# Patient Record
Sex: Male | Born: 1990 | Race: White | Hispanic: No | Marital: Married | Smoking: Never smoker
Health system: Southern US, Community
[De-identification: ages and names within clinical notes are randomized; demographics above are authoritative.]

---

## 2015-10-14 ENCOUNTER — Other Ambulatory Visit (HOSPITAL_COMMUNITY): Payer: Self-pay | Admitting: Chiropractic Medicine

## 2015-10-14 ENCOUNTER — Ambulatory Visit (HOSPITAL_COMMUNITY)
Admission: RE | Admit: 2015-10-14 | Discharge: 2015-10-14 | Disposition: A | Discharge status: Home / Self Care | Payer: 59 | Source: Ambulatory Visit | Attending: Chiropractic Medicine | Admitting: Chiropractic Medicine

## 2015-10-14 ENCOUNTER — Emergency Department (HOSPITAL_COMMUNITY)
Admission: EM | Admit: 2015-10-14 | Discharge: 2015-10-14 | Disposition: A | Discharge status: Home / Self Care | Payer: 59 | Attending: Emergency Medicine | Admitting: Emergency Medicine

## 2015-10-14 ENCOUNTER — Encounter (HOSPITAL_COMMUNITY): Payer: Self-pay | Admitting: Emergency Medicine

## 2015-10-14 DIAGNOSIS — M545 Low back pain, unspecified: Secondary | ICD-10-CM

## 2015-10-14 DIAGNOSIS — M62838 Other muscle spasm: Secondary | ICD-10-CM | POA: Insufficient documentation

## 2015-10-14 DIAGNOSIS — M5136 Other intervertebral disc degeneration, lumbar region: Secondary | ICD-10-CM

## 2015-10-14 DIAGNOSIS — R52 Pain, unspecified: Secondary | ICD-10-CM

## 2015-10-14 MED ORDER — IBUPROFEN 800 MG PO TABS: 800.0000 mg | ORAL_TABLET | Freq: Three times a day (TID) | ORAL | 0 refills | Status: DC

## 2015-10-14 MED ORDER — CYCLOBENZAPRINE HCL 10 MG PO TABS: 10.0000 mg | ORAL_TABLET | Freq: Two times a day (BID) | ORAL | 0 refills | Status: DC | PRN

## 2015-10-14 MED ORDER — IBUPROFEN 800 MG PO TABS
800.0000 mg | ORAL_TABLET | Freq: Once | ORAL | Status: AC
  Administered 2015-10-14: 800 mg via ORAL
  Filled 2015-10-14 (×2): qty 1

## 2015-10-14 NOTE — ED Triage Notes (Signed)
Patient having back spasms in lower back. Patient was seen at PCP today and [facility]. Patient was walking on side walk back towards car and back spasm caught causing patient to go to ground. patient denies any pain from fall or LOC.

## 2015-10-14 NOTE — ED Provider Notes (Signed)
[facility]-EMERGENCY DEPT Provider Note   CSN: [id number] Arrival date & time: 10/14/15  1847  By signing my name below, I, Emmanuella Mensah, attest that this documentation has been prepared under the direction and in the presence of Cheri Fowler, PA-C. Electronically Signed: Angelene Giovanni, ED Scribe. 10/14/15. 7:20 PM.   History   Chief Complaint Chief Complaint  Patient presents with  . Back Pain   HPI Comments:  Christian Trevino is a 25 y.o. male who presents to the Emergency Department complaining of gradually worsening moderate lower back pain he describes as cramping and spasm onset this morning. He reports associated intermittent episodes of "back spasms" where he notes sudden onset of sharp pain down his spine, causing his knees to buckle and fall. He states that his symptoms are worse while ambulating. He explains that he was seen by a Chiropractor today where he had treatment and was advised to come to [facility] for outpatient x-ray. He adds that when he was leaving radiology, he fell in the parking lot due to his back spasms. He denies any LOC or head injuries during the fall. Denies any other injuries.  No alleviating factors noted. He states that he tried 2 Aleve at 8 am this morning. He denies any IV drug use. Pt has NKDA. He denies any fever, chills, abdominal pain, numbness/tingling in BLE, weakness, bowel/bladder incontinence, urinary symptoms, or any other symptoms. No gait abnormalities.   The history is provided by the patient. No language interpreter was used.    History reviewed. No pertinent past medical history.  There are no active problems to display for this patient.   History reviewed. No pertinent surgical history.     Home Medications    Prior to Admission medications   Medication Sig Start Date End Date Taking? Authorizing Provider  cyclobenzaprine (FLEXERIL) 10 MG tablet Take 1 tablet (10 mg total) by mouth 2 (two) times daily as needed for muscle spasms. 10/14/15    Cheri Fowler, PA-C  ibuprofen (ADVIL,MOTRIN) 800 MG tablet Take 1 tablet (800 mg total) by mouth 3 (three) times daily. 10/14/15   Cheri Fowler, PA-C    Family History No family history on file.  Social History Social History  Substance Use Topics  . Smoking status: Never Smoker  . Smokeless tobacco: Never Used  . Alcohol use No     Allergies   Review of patient's allergies indicates no known allergies.   Review of Systems Review of Systems  Constitutional: Negative for chills and fever.  Gastrointestinal: Negative for abdominal pain.  Musculoskeletal: Positive for back pain.  Neurological: Negative for weakness and numbness.  All other systems reviewed and are negative.    Physical Exam Updated Vital Signs BP 128/76 (BP Location: Right Arm)   Pulse 80   Temp 98.6 F (37 C) (Oral)   Resp 17   Ht 5\' 11"  (1.803 m)   Wt 88.5 kg   SpO2 95%   BMI 27.20 kg/m   Physical Exam  Constitutional: He is oriented to person, place, and time. He appears well-developed and well-nourished.  HENT:  Head: Normocephalic and atraumatic.  Right Ear: External ear normal.  Left Ear: External ear normal.  Eyes: Conjunctivae are normal. No scleral icterus.  Neck: No tracheal deviation present.  Cardiovascular: Normal rate, regular rhythm, normal heart sounds and intact distal pulses.   Pulses:      Dorsalis pedis pulses are 2+ on the right side, and 2+ on the left side.  Pulmonary/Chest: Effort normal  and breath sounds normal. No respiratory distress.  Abdominal: Soft. Bowel sounds are normal. He exhibits no distension. There is no tenderness.  Musculoskeletal: Normal range of motion. He exhibits tenderness.       Lumbar back: He exhibits spasm. He exhibits no tenderness and no bony tenderness.  No spinous process tenderness.  No step offs. No crepitus.  Neurological: He is alert and oriented to person, place, and time.  No saddle anesthesia. Strength and sensation intact bilaterally  throughout lower extremities.  Normal gait.   Skin: Skin is warm and dry.  Psychiatric: He has a normal mood and affect. His behavior is normal.     ED Treatments / Results  DIAGNOSTIC STUDIES: Oxygen Saturation is 95% on RA, adequate by my interpretation.    COORDINATION OF CARE: 7:17 PM- Pt advised of plan for treatment and pt agrees. Pt will receive ibuprofen and Flexeril.    Labs (all labs ordered are listed, but only abnormal results are displayed) Labs Reviewed - No data to display  EKG  EKG Interpretation None       Radiology No results found.  Procedures Procedures (including critical care time)  Medications Ordered in ED Medications  ibuprofen (ADVIL,MOTRIN) tablet 800 mg (800 mg Oral Given 10/14/15 1942)     Initial Impression / Assessment and Plan / ED Course  Cheri FowlerKayla Taiga Lupinacci, PA-C has reviewed the triage vital signs and the nursing notes.  Pertinent labs & imaging results that were available during my care of the patient were reviewed by me and considered in my medical decision making (see chart for details).  Clinical Course   Patient presents with low back pain.  VSS.  No injury/trauma.  No red flags.  No neurological deficits.  Distal pulses intact.  Suspect low back strain or other mechanical cause.  Doubt cauda equina.  Doubt infectious process.  Doubt AAA.  Discussed return precautions to the ED.  Follow up with PCP.   Final Clinical Impressions(s) / ED Diagnoses   Final diagnoses:  Muscle spasm  Acute low back pain without sciatica, unspecified back pain laterality    New Prescriptions New Prescriptions   CYCLOBENZAPRINE (FLEXERIL) 10 MG TABLET    Take 1 tablet (10 mg total) by mouth 2 (two) times daily as needed for muscle spasms.   IBUPROFEN (ADVIL,MOTRIN) 800 MG TABLET    Take 1 tablet (800 mg total) by mouth 3 (three) times daily.   I personally performed the services described in this documentation, which was scribed in my presence. The  recorded information has been reviewed and is accurate.    Cheri FowlerKayla Bricelyn Freestone, PA-C 10/14/15 1950    Bethann BerkshireJoseph Zammit, MD 10/14/15 2223

## 2018-02-24 ENCOUNTER — Other Ambulatory Visit: Payer: Self-pay

## 2018-02-24 ENCOUNTER — Emergency Department (HOSPITAL_BASED_OUTPATIENT_CLINIC_OR_DEPARTMENT_OTHER): Payer: 59

## 2018-02-24 ENCOUNTER — Emergency Department (HOSPITAL_BASED_OUTPATIENT_CLINIC_OR_DEPARTMENT_OTHER)
Admission: EM | Admit: 2018-02-24 | Discharge: 2018-02-24 | Disposition: A | Discharge status: Home / Self Care | Payer: 59 | Attending: Emergency Medicine | Admitting: Emergency Medicine

## 2018-02-24 ENCOUNTER — Encounter (HOSPITAL_BASED_OUTPATIENT_CLINIC_OR_DEPARTMENT_OTHER): Payer: Self-pay | Admitting: *Deleted

## 2018-02-24 DIAGNOSIS — Y998 Other external cause status: Secondary | ICD-10-CM | POA: Diagnosis not present

## 2018-02-24 DIAGNOSIS — Y9389 Activity, other specified: Secondary | ICD-10-CM | POA: Diagnosis not present

## 2018-02-24 DIAGNOSIS — R52 Pain, unspecified: Secondary | ICD-10-CM | POA: Diagnosis not present

## 2018-02-24 DIAGNOSIS — S060X0A Concussion without loss of consciousness, initial encounter: Secondary | ICD-10-CM | POA: Diagnosis not present

## 2018-02-24 DIAGNOSIS — S0990XA Unspecified injury of head, initial encounter: Secondary | ICD-10-CM | POA: Diagnosis present

## 2018-02-24 DIAGNOSIS — W19XXXA Unspecified fall, initial encounter: Secondary | ICD-10-CM

## 2018-02-24 DIAGNOSIS — W132XXA Fall from, out of or through roof, initial encounter: Secondary | ICD-10-CM | POA: Insufficient documentation

## 2018-02-24 DIAGNOSIS — Y9289 Other specified places as the place of occurrence of the external cause: Secondary | ICD-10-CM | POA: Insufficient documentation

## 2018-02-24 LAB — CBC
HCT: 48.7 % (ref 39.0–52.0)
Hemoglobin: 16.1 g/dL (ref 13.0–17.0)
MCH: 30.3 pg (ref 26.0–34.0)
MCHC: 33.1 g/dL (ref 30.0–36.0)
MCV: 91.5 fL (ref 80.0–100.0)
Platelets: 178 10*3/uL (ref 150–400)
RBC: 5.32 MIL/uL (ref 4.22–5.81)
RDW: 12.8 % (ref 11.5–15.5)
WBC: 5.8 10*3/uL (ref 4.0–10.5)
nRBC: 0 % (ref 0.0–0.2)

## 2018-02-24 LAB — LACTIC ACID, PLASMA: Lactic Acid, Venous: 1.2 mmol/L (ref 0.5–1.9)

## 2018-02-24 LAB — COMPREHENSIVE METABOLIC PANEL
ALT: 22 U/L (ref 0–44)
AST: 23 U/L (ref 15–41)
Albumin: 4.3 g/dL (ref 3.5–5.0)
Alkaline Phosphatase: 71 U/L (ref 38–126)
Anion gap: 7 (ref 5–15)
BUN: 15 mg/dL (ref 6–20)
CO2: 24 mmol/L (ref 22–32)
Calcium: 8.8 mg/dL — ABNORMAL LOW (ref 8.9–10.3)
Chloride: 108 mmol/L (ref 98–111)
Creatinine, Ser: 1.24 mg/dL (ref 0.61–1.24)
GFR calc Af Amer: >60 mL/min (ref >60)
GFR calc non Af Amer: >60 mL/min (ref >60)
Glucose, Bld: 106 mg/dL — ABNORMAL HIGH (ref 70–99)
Potassium: 3.5 mmol/L (ref 3.5–5.1)
Sodium: 139 mmol/L (ref 135–145)
Total Bilirubin: 0.8 mg/dL (ref 0.3–1.2)
Total Protein: 7.2 g/dL (ref 6.5–8.1)

## 2018-02-24 MED ORDER — IOPAMIDOL (ISOVUE-300) INJECTION 61%
100.0000 mL | Freq: Once | INTRAVENOUS | Status: AC | PRN
  Administered 2018-02-24: 100 mL via INTRAVENOUS

## 2018-02-24 MED ORDER — ACETAMINOPHEN 325 MG PO TABS: 650.0000 mg | ORAL_TABLET | Freq: Once | ORAL | Status: DC

## 2018-02-24 MED ORDER — ONDANSETRON HCL 4 MG PO TABS: 4.0000 mg | ORAL_TABLET | Freq: Three times a day (TID) | ORAL | 0 refills | Status: DC | PRN

## 2018-02-24 MED ORDER — ONDANSETRON 4 MG PO TBDP: 4.0000 mg | ORAL_TABLET | Freq: Once | ORAL | Status: DC

## 2018-02-24 NOTE — ED Notes (Signed)
Pt taken to radiology

## 2018-02-24 NOTE — ED Triage Notes (Addendum)
Fall. He may have fallen off a roof. Confused. Ambulatory. No scalp hematomas noted. Pt denies neck pain. He does have some wrist pain and bruising to his right lower leg. His wife states he fell off a single story roof landing onto a wooden deck.

## 2018-02-24 NOTE — ED Provider Notes (Signed)
[facility] EMERGENCY DEPARTMENT Provider Note   CSN: [id number] Arrival date & time: 02/24/18  1849    History   Chief Complaint Chief Complaint  Patient presents with  . Fall    HPI Christian Trevino is a 28 y.o. male.     The history is provided by the patient.  Fall  This is a new problem. The current episode started 1 to 2 hours ago. The problem occurs constantly. The problem has not changed since onset.Associated symptoms include headaches. Pertinent negatives include no chest pain, no abdominal pain and no shortness of breath. Associated symptoms comments: Patient fell off roof prior to arrival trying to fix cable antenna. Ambulatory since but confused and family concerned for concussion/head injury. Has no complaints but continues to ask the same questions. Hx of concussion possibly. . Nothing aggravates the symptoms. Nothing relieves the symptoms. He has tried nothing for the symptoms.    History reviewed. No pertinent past medical history.  There are no active problems to display for this patient.   History reviewed. No pertinent surgical history.      Home Medications    Prior to Admission medications   Medication Sig Start Date End Date Taking? Authorizing Provider  cyclobenzaprine (FLEXERIL) 10 MG tablet Take 1 tablet (10 mg total) by mouth 2 (two) times daily as needed for muscle spasms. 10/14/15   Cheri Fowler, PA-C  ibuprofen (ADVIL,MOTRIN) 800 MG tablet Take 1 tablet (800 mg total) by mouth 3 (three) times daily. 10/14/15   Cheri Fowler, PA-C    Family History No family history on file.  Social History Social History   Tobacco Use  . Smoking status: Never Smoker  . Smokeless tobacco: Never Used  Substance Use Topics  . Alcohol use: No  . Drug use: Not on file     Allergies   Patient has no known allergies.   Review of Systems Review of Systems  Constitutional: Negative for chills and fever.  HENT: Negative for ear pain and sore throat.    Eyes: Negative for pain and visual disturbance.  Respiratory: Negative for cough and shortness of breath.   Cardiovascular: Negative for chest pain and palpitations.  Gastrointestinal: Negative for abdominal pain and vomiting.  Genitourinary: Negative for dysuria and hematuria.  Musculoskeletal: Positive for arthralgias (pain to right wrist and right tibia). Negative for back pain.  Skin: Negative for color change and rash.  Neurological: Positive for headaches. Negative for dizziness, tremors, seizures, syncope, facial asymmetry, speech difficulty, weakness and numbness.       Repetitive questioning  All other systems reviewed and are negative.    Physical Exam Updated Vital Signs BP 128/86   Pulse 82   Temp 98.4 F (36.9 C) (Oral)   Resp 16   Ht 6' (1.829 m)   Wt 90.7 kg   SpO2 99%   BMI 27.12 kg/m   Physical Exam Vitals signs and nursing note reviewed.  Constitutional:      Appearance: He is well-developed.  HENT:     Head: Normocephalic and atraumatic.     Right Ear: Tympanic membrane normal.     Left Ear: Tympanic membrane normal.     Nose: Nose normal.     Mouth/Throat:     Mouth: Mucous membranes are moist.  Eyes:     Extraocular Movements: Extraocular movements intact.     Conjunctiva/sclera: Conjunctivae normal.     Pupils: Pupils are equal, round, and reactive to light.  Neck:  Musculoskeletal: Normal range of motion and neck supple. No neck rigidity or muscular tenderness.  Cardiovascular:     Rate and Rhythm: Normal rate and regular rhythm.     Pulses: Normal pulses.     Heart sounds: Normal heart sounds. No murmur.  Pulmonary:     Effort: Pulmonary effort is normal. No respiratory distress.     Breath sounds: Normal breath sounds.  Abdominal:     General: There is no distension.     Palpations: Abdomen is soft.     Tenderness: There is no abdominal tenderness. There is guarding.  Musculoskeletal: Normal range of motion.        General:  Tenderness (right tibia, right wrist) present. No deformity.     Right lower leg: No edema.     Left lower leg: No edema.     Comments: No midline spinal tenderness  Skin:    General: Skin is warm and dry.     Capillary Refill: Capillary refill takes less than 2 seconds.     Findings: Bruising present. No rash.     Comments: Abrasion to right tibia  Neurological:     General: No focal deficit present.     Mental Status: He is alert.     Cranial Nerves: No cranial nerve deficit.     Sensory: No sensory deficit.     Motor: No weakness.     Coordination: Coordination normal.     Gait: Gait normal.     Comments: Patient with repetitive questioning but is able to follow commands, moves all extremities, appears neurologically intact with 5+ out of 5 strength throughout, normal sensation, no drift, normal finger-to-nose finger  Psychiatric:        Mood and Affect: Mood normal.      ED Treatments / Results  Labs (all labs ordered are listed, but only abnormal results are displayed) Labs Reviewed  COMPREHENSIVE METABOLIC PANEL - Abnormal; Notable for the following components:      Result Value   Glucose, Bld 106 (*)    Calcium 8.8 (*)    All other components within normal limits  CBC  LACTIC ACID, PLASMA  URINALYSIS, ROUTINE W REFLEX MICROSCOPIC    EKG None  Radiology Dg Wrist Complete Right  Result Date: 02/24/2018 CLINICAL DATA:  Fall.  Right wrist pain.  Initial encounter. EXAM: RIGHT WRIST - COMPLETE 3+ VIEW COMPARISON:  None. FINDINGS: There is no evidence of fracture or dislocation. There is no evidence of arthropathy or other focal bone abnormality. Soft tissues are unremarkable. IMPRESSION: Negative. Electronically Signed   By: Sebastian Ache M.D.   On: 02/24/2018 20:28   Dg Tibia/fibula Right  Result Date: 02/24/2018 CLINICAL DATA:  Fall.  Lower leg pain.  Initial encounter. EXAM: RIGHT TIBIA AND FIBULA - 2 VIEW COMPARISON:  None. FINDINGS: There is no evidence of  fracture or other focal bone lesions. Soft tissues are unremarkable. IMPRESSION: Negative. Electronically Signed   By: Sebastian Ache M.D.   On: 02/24/2018 20:29   Ct Head Wo Contrast  Result Date: 02/24/2018 CLINICAL DATA:  28 year old male status post fall from roof. Right side headache and pain. EXAM: CT HEAD WITHOUT CONTRAST CT CERVICAL SPINE WITHOUT CONTRAST TECHNIQUE: Multidetector CT imaging of the head and cervical spine was performed following the standard protocol without intravenous contrast. Multiplanar CT image reconstructions of the cervical spine were also generated. COMPARISON:  None. FINDINGS: CT HEAD FINDINGS Brain: No midline shift, ventriculomegaly, mass effect, evidence of mass lesion, intracranial  hemorrhage or evidence of cortically based acute infarction. Gray-white matter differentiation is within normal limits throughout the brain. Vascular: No suspicious intracranial vascular hyperdensity. Skull: Intact. Sinuses/Orbits: Visualized paranasal sinuses and mastoids are well pneumatized. Other: Negative orbits. No scalp hematoma or soft tissue gas. Small benign appearing probable sebaceous cyst at the left vertex. CT CERVICAL SPINE FINDINGS Alignment: Partial straightening of cervical lordosis. Cervicothoracic junction alignment is within normal limits. Bilateral posterior element alignment is within normal limits. Skull base and vertebrae: Visualized skull base is intact. No atlanto-occipital dissociation. No acute osseous abnormality identified. Soft tissues and spinal canal: No prevertebral fluid or swelling. No visible canal hematoma. Negative noncontrast neck soft tissues. Disc levels:  No significant degenerative changes. Upper chest: Chest CT reported separately today. 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 IMPRESSION: 1. No acute traumatic injury identified in the head or cervical spine. 2. Negative noncontrast CT appearance of the brain. Electronically  Signed   By: Odessa Fleming M.D.   On: 02/24/2018 20:12   Ct Chest W Contrast  Result Date: 02/24/2018 CLINICAL DATA:  28 year old male status post fall from roof. Right side headache and pain. EXAM: CT CHEST, ABDOMEN, AND PELVIS WITH CONTRAST TECHNIQUE: Multidetector CT imaging of the chest, abdomen and pelvis was performed following the standard protocol during bolus administration of intravenous contrast. CONTRAST:  ISOVUE-300 IOPAMIDOL (ISOVUE-300) INJECTION 61% COMPARISON:  Cervical spine CT today reported separately. FINDINGS: CT CHEST FINDINGS Cardiovascular: Intact aorta. No cardiomegaly or pericardial effusion. Negative thoracic inlet. Mediastinum/Nodes: Small volume residual thymus suspected in the anterior superior mediastinum (normal variant). No convincing mediastinal hematoma. No lymphadenopathy. Lungs/Pleura: Major airways are patent. Minor dependent atelectasis in the right lung. No pneumothorax, pleural effusion, or pulmonary contusion. Musculoskeletal: No rib fracture identified. Subtle concavity of the T4 superior endplate (series 5, image 96) with no fracture lucency. Ununited T1 spinous process ossification center. Other thoracic vertebrae appear intact. Visible shoulder osseous structures appear intact. CT ABDOMEN PELVIS FINDINGS Hepatobiliary: Negative liver and gallbladder. Pancreas: Negative. Spleen: Negative. Adrenals/Urinary Tract: Normal adrenal glands. Punctate left renal lower pole nephrolithiasis (series 4, image 74). Punctate right renal upper pole nephrolithiasis suspected on image 88. Normal bilateral renal enhancement and contrast excretion. Normal proximal ureters. Unremarkable urinary bladder. Several pelvic phleboliths on the left. Stomach/Bowel: Mild motion artifact in the upper abdomen. Negative large bowel. Normal appendix (series 2, image 96). Negative terminal ileum. No dilated small bowel. Negative stomach and duodenum. No free air, free fluid. Vascular/Lymphatic:  Abdominal aorta and other major arterial structures appear patent and normal. Portal venous system is patent. Reproductive: Negative. Other: No pelvic free fluid. Musculoskeletal: Lumbar levels, sacrum and SI joints appear intact. No pelvis or proximal femur fracture. Benign bone island suspected in the left sacral ala. No superficial soft tissue injury identified. IMPRESSION: 1. No acute traumatic injury identified in the chest, abdomen, or pelvis. 2. Thoracic and Lumbar spine reported separately. 3. Punctate bilateral nephrolithiasis. Electronically Signed   By: Odessa Fleming M.D.   On: 02/24/2018 20:23   Ct Cervical Spine Wo Contrast  Result Date: 02/24/2018 CLINICAL DATA:  29 year old male status post fall from roof. Right side headache and pain. EXAM: CT HEAD WITHOUT CONTRAST CT CERVICAL SPINE WITHOUT CONTRAST TECHNIQUE: Multidetector CT imaging of the head and cervical spine was performed following the standard protocol without intravenous contrast. Multiplanar CT image reconstructions of the cervical spine were also generated. COMPARISON:  None. FINDINGS: CT HEAD FINDINGS Brain: No midline shift, ventriculomegaly, mass  effect, evidence of mass lesion, intracranial hemorrhage or evidence of cortically based acute infarction. Gray-white matter differentiation is within normal limits throughout the brain. Vascular: No suspicious intracranial vascular hyperdensity. Skull: Intact. Sinuses/Orbits: Visualized paranasal sinuses and mastoids are well pneumatized. Other: Negative orbits. No scalp hematoma or soft tissue gas. Small benign appearing probable sebaceous cyst at the left vertex. CT CERVICAL SPINE FINDINGS Alignment: Partial straightening of cervical lordosis. Cervicothoracic junction alignment is within normal limits. Bilateral posterior element alignment is within normal limits. Skull base and vertebrae: Visualized skull base is intact. No atlanto-occipital dissociation. No acute osseous abnormality  identified. Soft tissues and spinal canal: No prevertebral fluid or swelling. No visible canal hematoma. Negative noncontrast neck soft tissues. Disc levels:  No significant degenerative changes. Upper chest: Chest CT reported separately today. 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 IMPRESSION: 1. No acute traumatic injury identified in the head or cervical spine. 2. Negative noncontrast CT appearance of the brain. Electronically Signed   By: Odessa FlemingH  Hall M.D.   On: 02/24/2018 20:12   Ct Abdomen Pelvis W Contrast  Result Date: 02/24/2018 CLINICAL DATA:  28 year old male status post fall from roof. Right side headache and pain. EXAM: CT CHEST, ABDOMEN, AND PELVIS WITH CONTRAST TECHNIQUE: Multidetector CT imaging of the chest, abdomen and pelvis was performed following the standard protocol during bolus administration of intravenous contrast. CONTRAST:  100mL ISOVUE-300 IOPAMIDOL (ISOVUE-300) INJECTION 61% COMPARISON:  Cervical spine CT today reported separately. FINDINGS: CT CHEST FINDINGS Cardiovascular: Intact aorta. No cardiomegaly or pericardial effusion. Negative thoracic inlet. Mediastinum/Nodes: Small volume residual thymus suspected in the anterior superior mediastinum (normal variant). No convincing mediastinal hematoma. No lymphadenopathy. Lungs/Pleura: Major airways are patent. Minor dependent atelectasis in the right lung. No pneumothorax, pleural effusion, or pulmonary contusion. Musculoskeletal: No rib fracture identified. Subtle concavity of the T4 superior endplate (series 5, image 96) with no fracture lucency. Ununited T1 spinous process ossification center. Other thoracic vertebrae appear intact. Visible shoulder osseous structures appear intact. CT ABDOMEN PELVIS FINDINGS Hepatobiliary: Negative liver and gallbladder. Pancreas: Negative. Spleen: Negative. Adrenals/Urinary Tract: Normal adrenal glands. Punctate left renal lower pole nephrolithiasis (series 4, image 74).  Punctate right renal upper pole nephrolithiasis suspected on image 88. Normal bilateral renal enhancement and contrast excretion. Normal proximal ureters. Unremarkable urinary bladder. Several pelvic phleboliths on the left. Stomach/Bowel: Mild motion artifact in the upper abdomen. Negative large bowel. Normal appendix (series 2, image 96). Negative terminal ileum. No dilated small bowel. Negative stomach and duodenum. No free air, free fluid. Vascular/Lymphatic: Abdominal aorta and other major arterial structures appear patent and normal. Portal venous system is patent. Reproductive: Negative. Other: No pelvic free fluid. Musculoskeletal: Lumbar levels, sacrum and SI joints appear intact. No pelvis or proximal femur fracture. Benign bone island suspected in the left sacral ala. No superficial soft tissue injury identified. IMPRESSION: 1. No acute traumatic injury identified in the chest, abdomen, or pelvis. 2. Thoracic and Lumbar spine reported separately. 3. Punctate bilateral nephrolithiasis. Electronically Signed   By: Odessa FlemingH  Hall M.D.   On: 02/24/2018 20:23   Ct T-spine No Charge  Result Date: 02/24/2018 CLINICAL DATA:  28 year old male status post fall from roof. Right side headache and pain. EXAM: CT THORACIC AND LUMBAR SPINE WITH CONTRAST TECHNIQUE: Technique: Multiplanar CT images of the thoracic and lumbar spine were reconstructed from contemporary CT of the chest common abdomen and Pelvis. CONTRAST:  None additional. COMPARISON:  CT cervical spine, chest, Abdomen, and Pelvis today are reported  separately. FINDINGS: THORACIC SPINE: Segmentation: Normal. Alignment: Preserved thoracic kyphosis. Vertebrae: Bone mineralization is within normal limits. Subtle concavity of the T4 superior endplate (series 10, image 23), but no associated fracture lucency, and no paravertebral soft tissue stranding or edema is evident. The other thoracic vertebrae appear intact. There is an unfused ossification center of the T1  spinous process (normal variant). Paraspinal and other soft tissues: Negative visualized posterior paraspinal soft tissues. Chest and abdominal viscera reported separately today. Disc levels: Evidence of minimal disc and endplate degeneration at T8-T9. Minimal posterior element hypertrophy at T10-T11. Otherwise negative. No CT evidence of thoracic spinal stenosis. LUMBAR SPINE: Segmentation: Normal. Alignment: Mild retrolisthesis of L5 on S1. Otherwise normal lumbar lordosis. Vertebrae: Intact lumbar vertebrae. Intact visible sacrum and SI joints. Benign left S1 sacral bone island suspected. Paraspinal and other soft tissues: Negative visualized posterior paraspinal soft tissues. Abdominal and pelvic viscera reported separately. Disc levels: Lumbar disc bulging at L4-L5 and L5-S1 with mild endplate spurring. Mild facet hypertrophy at the latter. Capacious spinal canal, no lumbar spinal stenosis. IMPRESSION: 1. Subtle concavity of the T4 superior endplate, but no visible fracture. This is probably a normal variant but if the patient has persistent upper thoracic spine then consider a mild traumatic compression fracture (non-contrast Thoracic Spine MRI would be confirmatory). 2. Otherwise no acute osseous abnormality in the thoracic and lumbar spine. 3. Mild lower lumbar disc and endplate degeneration. No thoracic or lumbar spinal stenosis. 4.  CT Chest, Abdomen, and Pelvis today are reported separately. Electronically Signed   By: Odessa Fleming M.D.   On: 02/24/2018 20:46   Ct L-spine No Charge  Result Date: 02/24/2018 CLINICAL DATA:  28 year old male status post fall from roof. Right side headache and pain. EXAM: CT THORACIC AND LUMBAR SPINE WITH CONTRAST TECHNIQUE: Technique: Multiplanar CT images of the thoracic and lumbar spine were reconstructed from contemporary CT of the chest common abdomen and Pelvis. CONTRAST:  None additional. COMPARISON:  CT cervical spine, chest, Abdomen, and Pelvis today are reported  separately. FINDINGS: THORACIC SPINE: Segmentation: Normal. Alignment: Preserved thoracic kyphosis. Vertebrae: Bone mineralization is within normal limits. Subtle concavity of the T4 superior endplate (series 10, image 23), but no associated fracture lucency, and no paravertebral soft tissue stranding or edema is evident. The other thoracic vertebrae appear intact. There is an unfused ossification center of the T1 spinous process (normal variant). Paraspinal and other soft tissues: Negative visualized posterior paraspinal soft tissues. Chest and abdominal viscera reported separately today. Disc levels: Evidence of minimal disc and endplate degeneration at T8-T9. Minimal posterior element hypertrophy at T10-T11. Otherwise negative. No CT evidence of thoracic spinal stenosis. LUMBAR SPINE: Segmentation: Normal. Alignment: Mild retrolisthesis of L5 on S1. Otherwise normal lumbar lordosis. Vertebrae: Intact lumbar vertebrae. Intact visible sacrum and SI joints. Benign left S1 sacral bone island suspected. Paraspinal and other soft tissues: Negative visualized posterior paraspinal soft tissues. Abdominal and pelvic viscera reported separately. Disc levels: Lumbar disc bulging at L4-L5 and L5-S1 with mild endplate spurring. Mild facet hypertrophy at the latter. Capacious spinal canal, no lumbar spinal stenosis. IMPRESSION: 1. Subtle concavity of the T4 superior endplate, but no visible fracture. This is probably a normal variant but if the patient has persistent upper thoracic spine then consider a mild traumatic compression fracture (non-contrast Thoracic Spine MRI would be confirmatory). 2. Otherwise no acute osseous abnormality in the thoracic and lumbar spine. 3. Mild lower lumbar disc and endplate degeneration. No thoracic or lumbar spinal stenosis. 4.  CT  Chest, Abdomen, and Pelvis today are reported separately. Electronically Signed   By: Odessa Fleming M.D.   On: 02/24/2018 20:46    Procedures Procedures (including  critical care time)  Medications Ordered in ED Medications  acetaminophen (TYLENOL) tablet 650 mg (has no administration in time range)  ondansetron (ZOFRAN-ODT) disintegrating tablet 4 mg (has no administration in time range)  iopamidol (ISOVUE-300) 61 % injection 100 mL (100 mLs Intravenous Contrast Given 02/24/18 1929)     Initial Impression / Assessment and Plan / ED Course  I have reviewed the triage vital signs and the nursing notes.  Pertinent labs & imaging results that were available during my care of the patient were reviewed by me and considered in my medical decision making (see chart for details).        Andres Bantz is a 28 year old male with no significant medical history presents the ED after a fall.  Patient with normal vitals.  No fever.  Patient apparently fell about 8 to 10 feet off of a roof onto a deck.  Has abrasion to his right tibia, has had repetitive questioning since the fall.  No obvious scalp hematoma.  No midline spinal tenderness, no abdominal tenderness on exam.  He appears neurologically intact except for he appears confused and concussed.  Overall given mechanism will obtain trauma scans of head, neck, chest, abdomen, pelvis.  We will get x-rays of his right lower leg and right wrist as that is where he is tender.  Wife states that he does go up to the roof on time to fix satellite dish and that is what he was doing tonight.  No alcohol use or drug use.  Wife states that he appears to be less confused than when she originally saw him.  Patient with no acute intracranial process.  No head bleed.  No significant findings on the CT of the head, neck, chest, abdomen, pelvis.  X-rays of the extremities were unremarkable.  Patient was given Tylenol and Zofran with improvement.  Upon reevaluation patient is alert and oriented.  He knows the names of everybody in the room.  He is able to ambulate without any issues.  Does have mild headache but no new dizziness, no  nausea.  GCS is 15.  Patient lives at home with his wife and is able to take care of him at home.  Recommend time off of work and cognitive rest.  No driving, no operating heavy machinery until he is symptom-free.  He has had good improvement of his mental status and neurological status throughout my care.  Patient likely with concussion.  Will give information to follow-up with concussion specialist.  Given return precautions and educated about concussions.  Discharged from ED in good condition.  This chart was dictated using voice recognition software.  Despite best efforts to proofread,  errors can occur which can change the documentation meaning.    Final Clinical Impressions(s) / ED Diagnoses   Final diagnoses:  Fall  Concussion without loss of consciousness, initial encounter    ED Discharge Orders    None       Virgina Norfolk, DO 02/24/18 2102

## 2018-02-24 NOTE — ED Notes (Signed)
Pt refused medication. States that he is not nauseated and will take tylenol at home.  Pt ambulatory out of the dept with his wife.  No questions.  Strict f/u with instructions to call the concussion clinic tomorrow morning.

## 2018-02-25 ENCOUNTER — Ambulatory Visit: Payer: 59 | Admitting: Family Medicine

## 2018-02-25 ENCOUNTER — Encounter: Payer: Self-pay | Admitting: Family Medicine

## 2018-02-25 ENCOUNTER — Telehealth: Payer: Self-pay | Admitting: Family Medicine

## 2018-02-25 VITALS — BP 114/90 | HR 90 | Resp 16 | Ht 72.0 in | Wt 201.0 lb

## 2018-02-25 DIAGNOSIS — S060X0A Concussion without loss of consciousness, initial encounter: Secondary | ICD-10-CM

## 2018-02-25 NOTE — Progress Notes (Signed)
Christian Trevino - 28 y.o. male MRN [id number]  Date of birth: [birth year]  SUBJECTIVE:  Including CC & ROS.  No chief complaint on file.   Christian Trevino is a 28 y.o. male that is presenting with symptoms suggestive of concussion.  He was up on the roof yesterday and trying to clean the snow off the satellite dish.  He is unsure of how he fell but he ended up falling off the roof.  He does not remember the fall or event shortly after landing.  He was taken to the emergency department and had negative imaging.  It was thought that he was concussed at that time.  He had trouble falling asleep last night as he is afraid he will fall into a coma.  He has symptoms of sadness, irritability and anxiety that seem to be predominating.  No history of prior concussion.  He works with [employer].  He denies any sense of dizziness or headache.  No history of mental illness or family history of mental illness.  Does not take medications on a regular basis.  Does not see a regular doctor on a regular basis.  Date of concussion: 2/20 LOC at time of injury? Patient's wife denies LOC but patient fails to amnesia in regards to the fall and shortly after landing on the ground.  Neck exam :  Symptom score? (22)      21 Severity score ? (132)    86  Review of the CT lumbar and thoracic spine from yesterday shows a subtle concavity of the T4 superior endplate but no fracture.  Mild lower lumbar disc degeneration.   Review of the CT chest, abdomen and pelvis from yesterday shows no traumatic injury.  Review of the CT head from yesterday no acute injury of the cervical spine and negative of the brain.  Independent review of the tibia and tibia x-ray from yesterday was a normal exam.  Independent review of the right wrist x-ray from yesterday shows a normal exam.  Review of Systems  Constitutional: Negative for fever.  HENT: Negative for congestion.   Respiratory: Negative for cough.   Cardiovascular: Negative for chest  pain.  Gastrointestinal: Negative for abdominal pain.  Musculoskeletal: Positive for neck pain.  Skin: Negative for color change.  Neurological: Negative for weakness.  Hematological: Negative for adenopathy.  Psychiatric/Behavioral: Positive for agitation. The patient is nervous/anxious.     HISTORY: Past Medical, Surgical, Social, and Family History Reviewed & Updated per EMR.   Pertinent Historical Findings include:  History reviewed. No pertinent past medical history.  History reviewed. No pertinent surgical history.  No Known Allergies  History reviewed. No pertinent family history.   Social History   Socioeconomic History  . Marital status: Married    Spouse name: Not on file  . Number of children: Not on file  . Years of education: Not on file  . Highest education level: Not on file  Occupational History  . Not on file  Social Needs  . Financial resource strain: Not on file  . Food insecurity:    Worry: Not on file    Inability: Not on file  . Transportation needs:    Medical: Not on file    Non-medical: Not on file  Tobacco Use  . Smoking status: Never Smoker  . Smokeless tobacco: Never Used  Substance and Sexual Activity  . Alcohol use: No  . Drug use: Not on file  . Sexual activity: Not on file  Lifestyle  .  Physical activity:    Days per week: Not on file    Minutes per session: Not on file  . Stress: Not on file  Relationships  . Social connections:    Talks on phone: Not on file    Gets together: Not on file    Attends religious service: Not on file    Active member of club or organization: Not on file    Attends meetings of clubs or organizations: Not on file    Relationship status: Not on file  . Intimate partner violence:    Fear of current or ex partner: Not on file    Emotionally abused: Not on file    Physically abused: Not on file    Forced sexual activity: Not on file  Other Topics Concern  . Not on file  Social History Narrative    . Not on file     PHYSICAL EXAM:  VS: BP 114/90   Pulse 90   Resp 16   Ht 6' (1.829 m)   Wt 201 lb (91.2 kg)   SpO2 98%   BMI 27.26 kg/m  Physical Exam Gen: NAD, alert, cooperative with exam, ENT: normal lips, normal nasal mucosa,  Eye: normal EOM, normal conjunctiva and lids CV:  no edema, +2 pedal pulses   Resp: no accessory muscle use, non-labored,  Skin: no rashes, no areas of induration  Neuro: normal tone, normal sensation to touch, cranial nerves II through XII intact, no reproduction of symptoms with horizontal or vertical gaze or smooth pursuits. Psych:  normal insight, alert and oriented MSK:  Neck: Normal range of motion. Tenderness to palpation most evident over C7. Some pain reproduced with resistance to lateral rotation bilaterally. Normal strength resistance with shrug. Normal shoulder range of motion. Normal grip strength. Normal gait. Neurovascular intact     ASSESSMENT & PLAN:   I spent 30 minutes with this patient, greater than 50% was face-to-face time counseling regarding the below diagnosis.   Concussion with no loss of consciousness Seems to be demonstrating a change in his mood as a significant symptom of his concussion.  Likely anxiety subtype.  No history of concussion or personal history of mental disorder. -Counseled on management.  Discussed sleep hygiene and melatonin.  Counseled on sleep hygiene -Counseled on exercise -Counseled on work and can provide accommodations as he may need. -Advised to follow-up in 1 week in concussion clinic. May consider referral to psych if symptoms still persistent

## 2018-02-25 NOTE — Telephone Encounter (Signed)
I have scheduled patient for 2/25 at 2:45pm for concussion clinic.  If this time does not work out please follow up with patient in regard.  Thanks!Marland Kitchen

## 2018-02-25 NOTE — Telephone Encounter (Signed)
Copied from CRM [id number]. Topic: General - Other >> Feb 25, 2018 10:47 AM Christian Trevino wrote: Reason for CRM: Chip Boer mom of patient is calling stating that her son was seen in the ED last night he fell off a roof and has concussion, paperwork states for him to be seen today, there are no appts for today but Monday, mom insisted he be seen today  Call back is [phone] This is his wife Larna Daughters number

## 2018-02-25 NOTE — Patient Instructions (Signed)
Nice to meet you  Please try melatonin for sleep and see the co-Q10. Please try fish oil. Please try light exercise as you tolerate. Please drink plenty of water and eat a well-rounded diet. Please follow up in one week in the concussion clinic   Sleep is an integral part of our bodies ability to recover from our daily activities and is key to making you body perform at its maximal potential.  Establishing and maintaining a healthy sleep pattern can not only make you feel better it can help many chronic illnesses including high blood pressure, high cholesterol, obesity, chronic pain syndromes, and many others.  Some key things to remember regarding sleep are:  Establish a consistent nightly routine that you do each night before bed.  Avoid caffeine, tobacco and alcohol as all of these drugs will cause sleep disturbances.    Establish an exercise routine.  This can be as simple as walking, dancing or what every you find gets your heart rate elevated to the point you can speak in only 3-4 word sentences.  Exercise will help make falling and staying asleep easier.    If you have difficulty with sleep, reserve the bedroom for sleeping; do not watch TV, read, eat or exercise in your bed room.      - Watching TV in bed can trick your brain into thinking it is day time and will reset your internal clock.  If you are going to watch TV before bed do so outside of the bedroom.  Although it is best to avoid screens for the hour prior to bed that is difficult to do in our current technology driven society - at the very least it is imperative that you remove TV from the bed room.      - If you have a hard time falling asleep avoid showering and exercising prior to bed.

## 2018-02-25 NOTE — Telephone Encounter (Signed)
I have scheduled patient for evaluation with Jordan Likes.  Please schedule patient for concussion clinic for next week.

## 2018-02-28 ENCOUNTER — Encounter: Payer: Self-pay | Admitting: Family Medicine

## 2018-02-28 DIAGNOSIS — S060X0A Concussion without loss of consciousness, initial encounter: Secondary | ICD-10-CM | POA: Insufficient documentation

## 2018-02-28 NOTE — Assessment & Plan Note (Signed)
Seems to be demonstrating a change in his mood as a significant symptom of his concussion.  Likely anxiety subtype.  No history of concussion or personal history of mental disorder. -Counseled on management.  Discussed sleep hygiene and melatonin.  Counseled on sleep hygiene -Counseled on exercise -Counseled on work and can provide accommodations as he may need. -Advised to follow-up in 1 week in concussion clinic. May consider referral to psych if symptoms still persistent

## 2018-02-28 NOTE — Progress Notes (Signed)
Subjective:   I, Wilford Grist, am serving as a scribe for Dr. Antoine Primas.   Chief Complaint: Christian Trevino, DOB: 09/29/1990, is a 28 y.o. male who presents for head injury. Patient states that he was on his roof cleaning off snow and slipped and fell. Patient hit the right side of head. No LOC. Patient has been having intermittent headaches and sharp pains that occur over the area where he struck his head. 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 Has history of 4-5 concussions. None with a loss of consciousness and last concussion was 5 years ago.    Injury date : 02/24/2018 Visit #: 1  Previous imagine.  Patient did have a CT scan that was unremarkable.  History of Present Illness:    Concussion Self-Reported Symptom Score Symptoms rated on a scale 1-6, in last 24 hours  Headache: 1   Nausea: 0  Vomiting: 0  Balance Difficulty: 0  Dizziness:0  Fatigue: 2  Trouble Falling Asleep: 1  Sleep More Than Usual: 0  Sleep Less Than Usual: 0  Daytime Drowsiness: 2  Photophobia: 1  Phonophobia: 3  Irritability:3  Sadness: 2  Nervousness: 2  Feeling More Emotional: 2  Numbness or Tingling: 0  Feeling Slowed Down: 2  Feeling Mentally Foggy0  Difficulty Concentrating:0  Difficulty Remembering:0  Visual Problems: 0     Total Symptom Score: 20   Review of Systems: Pertinent items are noted in HPI.  Review of History: Past Medical History: @[facility] @  Past Surgical History:  has no past surgical history on file. Family History: family history is not on file. no family history of autoimmune Social History:  reports that he has never smoked. He has never used smokeless tobacco. He reports that he does not drink alcohol. No history on file for drug. Current Medications: has a current medication list which includes the following prescription(s): hydroxyzine. Allergies: has No Known Allergies.  Objective:    Physical Examination Vitals:   03/01/18 1452   BP: 110/80  Pulse: 78  SpO2: 96%   General: No apparent distress alert and oriented x3 mood anxious and affect normal, dressed appropriately.  HEENT: Pupils equal, extraocular movements intact  Respiratory: Patient's speak in full sentences and does not appear short of breath  Cardiovascular: No lower extremity edema, non tender, no erythema  Skin: Warm dry intact with no signs of infection or rash on extremities or on axial skeleton.  Abdomen: Soft nontender  Neuro: Cranial nerves II through XII are intact, neurovascularly intact in all extremities with 2+ DTRs and 2+ pulses.  Lymph: No lymphadenopathy of posterior or anterior cervical chain or axillae bilaterally.  Gait normal with good balance and coordination.  MSK:  Non tender with full range of motion and good stability and symmetric strength and tone of shoulders, elbows, wrist,  knee and ankles bilaterally.  Psychiatric: Oriented X3, intact recent and remote memory, judgement and insight, normal mood and affect  Concussion testing performed today:  .    Vestibular Screening:       Headache  Dizziness  Smooth Pursuits n n  H. Saccades n n  V. Saccades n n  H. VOR n n  V. VOR n n  Visual Motor Sensitivity n n      Convergence: 0cm  n n   Balance Screen: 30/30

## 2018-03-01 ENCOUNTER — Ambulatory Visit: Payer: 59 | Admitting: Family Medicine

## 2018-03-01 DIAGNOSIS — S060X0D Concussion without loss of consciousness, subsequent encounter: Secondary | ICD-10-CM

## 2018-03-01 MED ORDER — HYDROXYZINE HCL 10 MG PO TABS: 10.0000 mg | ORAL_TABLET | Freq: Three times a day (TID) | ORAL | 0 refills | Status: AC | PRN

## 2018-03-01 NOTE — Assessment & Plan Note (Signed)
Patient was seen by another provider previously.  Does still have some mood disorder that could be contributing but patient is nearly symptom-free.  Patient is actually fairly fortunate.  Discussed with patient as long as patient continues to work at this level I think he will do fine.  Patient given some hydroxyzine for the labile emotional

## 2018-03-01 NOTE — Patient Instructions (Addendum)
Good to see you  Fish oil 2 grams daily  Vitamin D 2000 IU daily  I think you should do well  For the feeling of being overwhelmed can try the hydroxyzine up to 3 times a day but may make you sleepy so try at home first  Have an appointment set up in 2-3 weeks just in case but if fine then cancel

## 2018-03-22 ENCOUNTER — Ambulatory Visit: Payer: 59 | Admitting: Family Medicine

## 2019-10-20 IMAGING — CT CT T SPINE W/O CM
3 of 6 series · 12 of 33 positions shown, 13 images · IV contrast (agent unspecified)
Comparison: CT cervical spine, chest, Abdomen, and Pelvis today are
reported separately.

CLINICAL DATA: 28-year-old male status post fall from roof. Right
side headache and pain.

EXAM:
CT THORACIC AND LUMBAR SPINE WITH CONTRAST
TECHNIQUE: 
TECHNIQUE: Multiplanar CT images of the thoracic and lumbar spine
were reconstructed from contemporary CT of the chest common abdomen
and Pelvis.
CONTRAST:  None additional.

[Series 2: cap with 2 · axial · 0.79mm/px · z∈[+381,+771]mm · 4 of 132 slices shown, 5 images]
[im 27/132  soft-tissue]
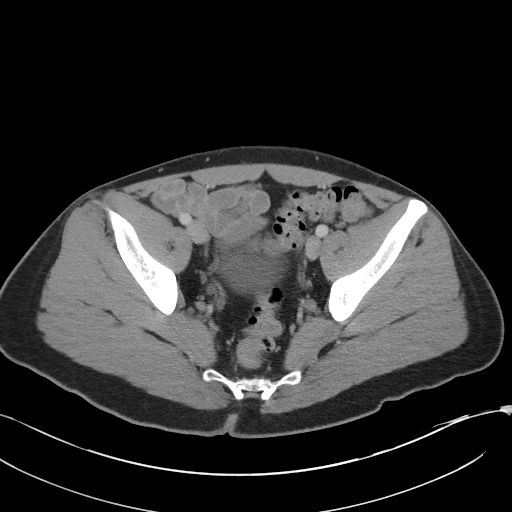
[im 27/132  bone]
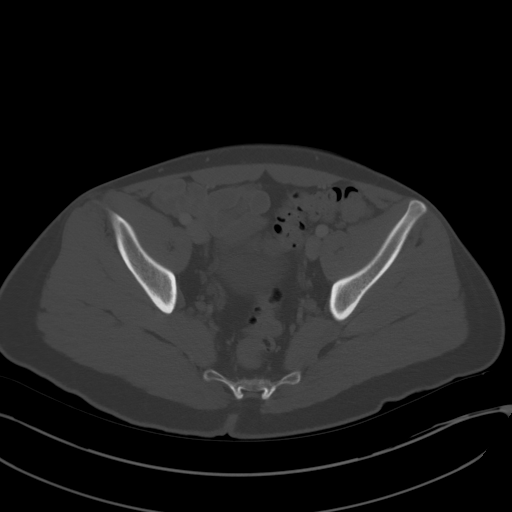
[im 53/132  bone]
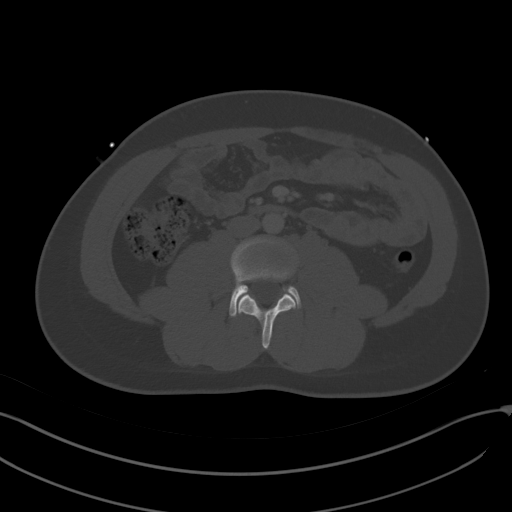
[im 79/132  bone]
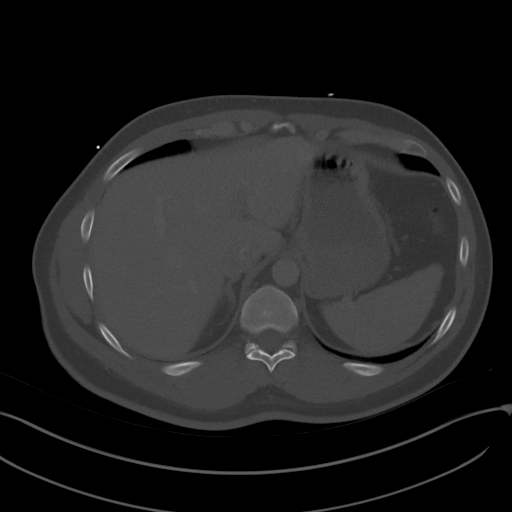
[im 105/132  bone]
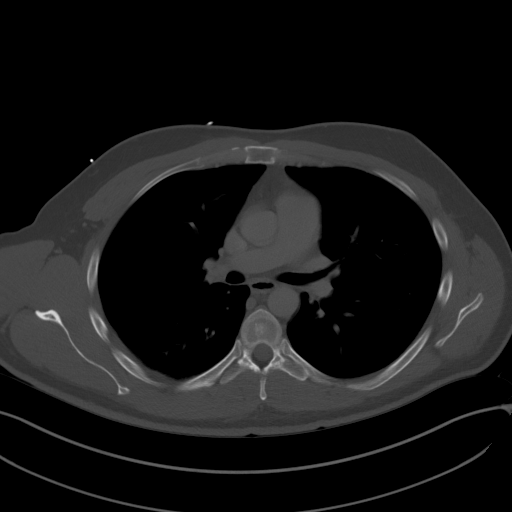

[Series 4: coronals · coronal · 0.95mm/px · 3 of 134 slices shown]
[im 27/134  bone]
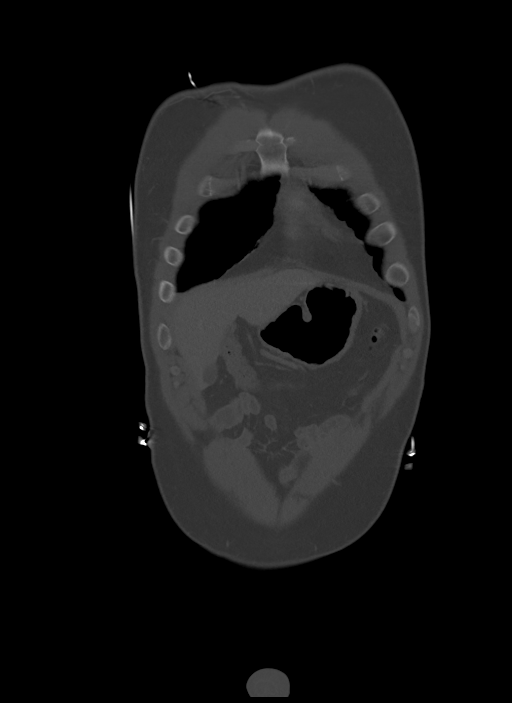
[im 54/134  bone]
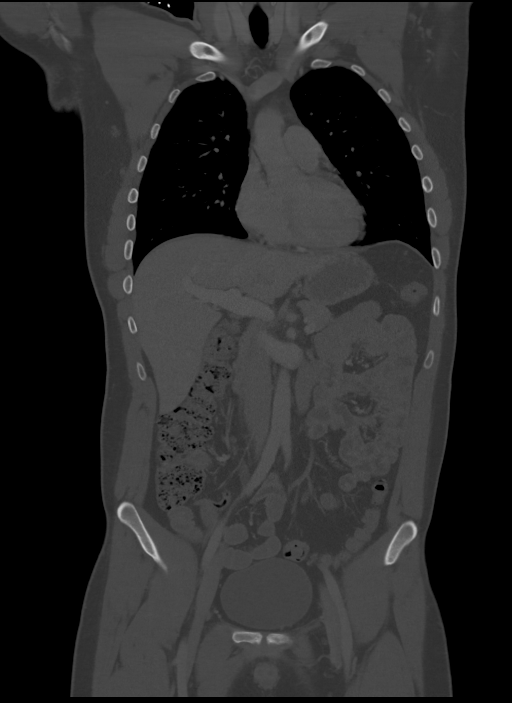
[im 80/134  bone]
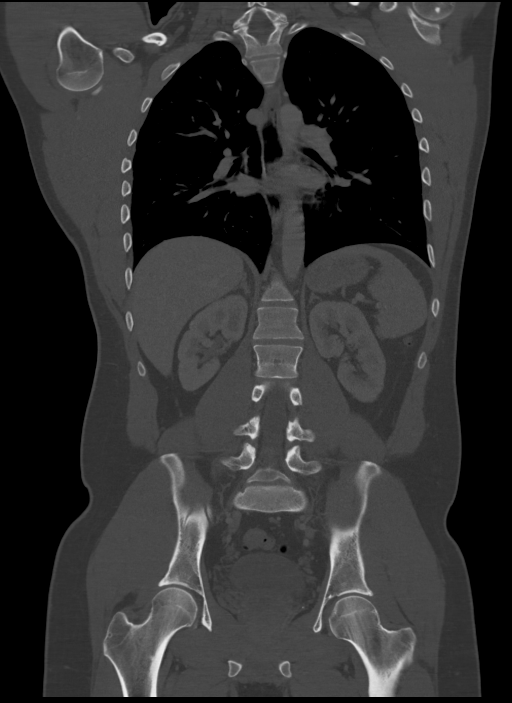

[Series 5: sagittals · sagittal · 0.82mm/px · 5 of 193 slices shown]
[im 43/193  bone]
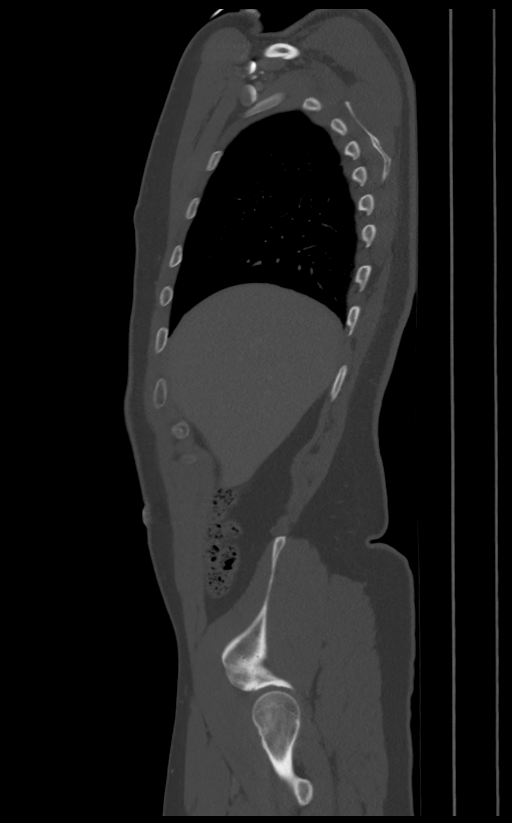
[im 65/193  bone]
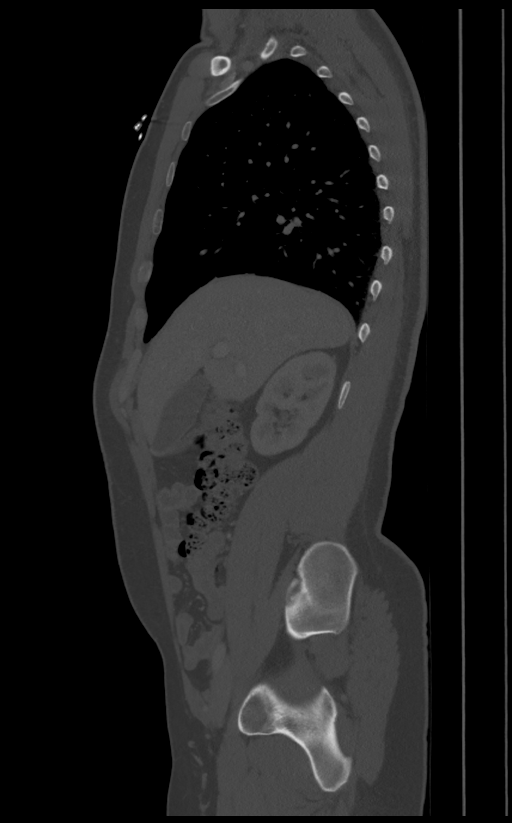
[im 86/193  bone]
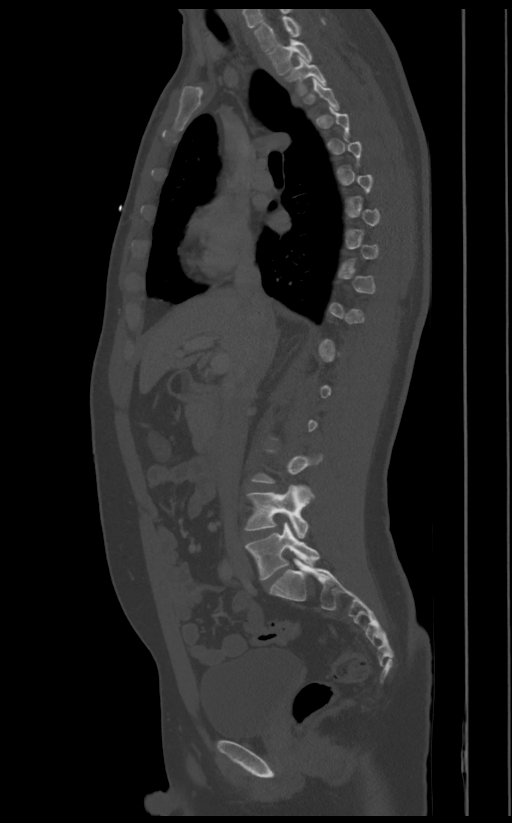
[im 107/193  bone]
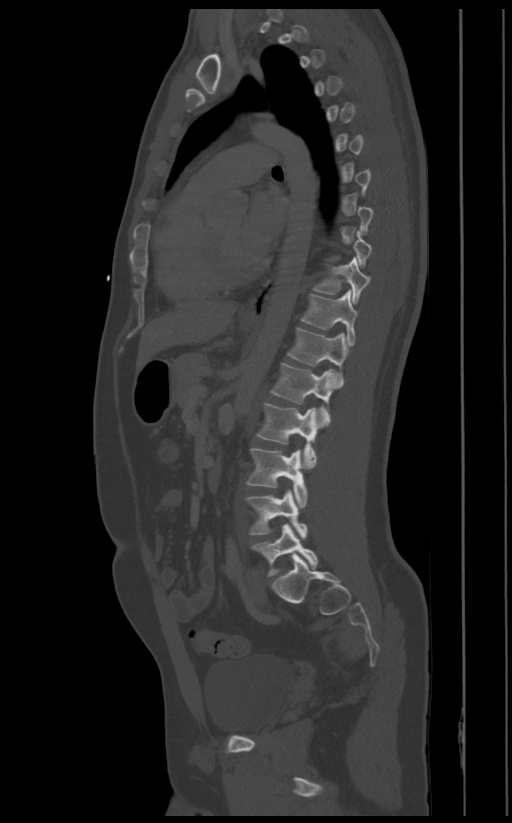
[im 129/193  bone]
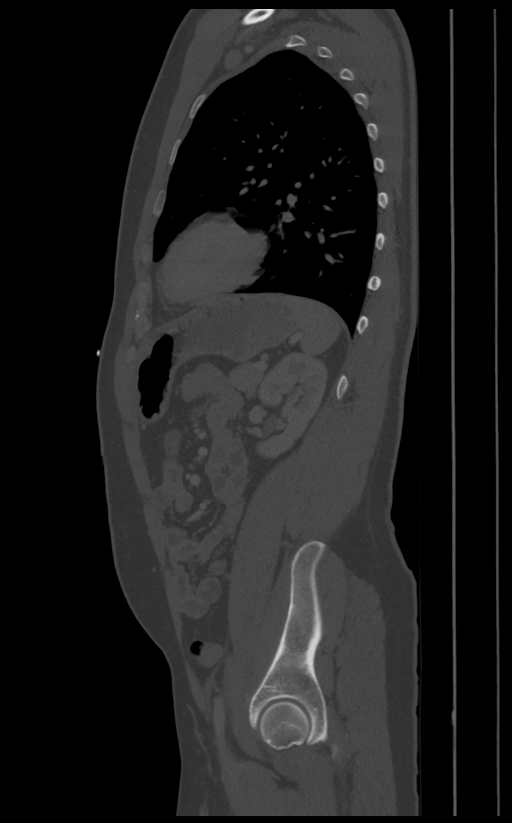

[12 of 33 positions shown; findings below may reference images not displayed]

FINDINGS: THORACIC SPINE:

Segmentation: Normal.

Alignment: Preserved thoracic kyphosis.

Vertebrae: Bone mineralization is within normal limits. Subtle
concavity of the T4 superior endplate (series 10, image 23), but no
associated fracture lucency, and no paravertebral soft tissue
stranding or edema is evident. The other thoracic vertebrae appear
intact. There is an unfused ossification center of the T1 spinous
process (normal variant).

Paraspinal and other soft tissues: Negative visualized posterior
paraspinal soft tissues. Chest and abdominal viscera reported
separately today.

Disc levels: Evidence of minimal disc and endplate degeneration at
T8-T9. Minimal posterior element hypertrophy at T10-T11. Otherwise
negative. No CT evidence of thoracic spinal stenosis.

LUMBAR SPINE:

Segmentation: Normal.

Alignment: Mild retrolisthesis of L5 on S1. Otherwise normal lumbar
lordosis.

Vertebrae: Intact lumbar vertebrae. Intact visible sacrum and SI
joints. Benign left S1 sacral bone island suspected.

Paraspinal and other soft tissues: Negative visualized posterior
paraspinal soft tissues. Abdominal and pelvic viscera reported
separately.

Disc levels: Lumbar disc bulging at L4-L5 and L5-S1 with mild
endplate spurring. Mild facet hypertrophy at the latter. Capacious
spinal canal, no lumbar spinal stenosis.
IMPRESSION: 1. Subtle concavity of the T4 superior endplate, but no visible
fracture.
This is probably a normal variant but if the patient has persistent
upper thoracic spine then consider a mild traumatic compression
fracture (non-contrast Thoracic Spine MRI would be confirmatory).
2. Otherwise no acute osseous abnormality in the thoracic and lumbar
spine.
3. Mild lower lumbar disc and endplate degeneration. No thoracic or
lumbar spinal stenosis.
4.  CT Chest, Abdomen, and Pelvis today are reported separately.

## 2019-10-20 IMAGING — DX DG WRIST COMPLETE 3+V*R*
4 series · 4 of 4 positions shown · non-contrast
Comparison: None.

CLINICAL DATA: Fall.  Right wrist pain.  Initial encounter.

EXAM:
RIGHT WRIST - COMPLETE 3+ VIEW

[wrist pa]
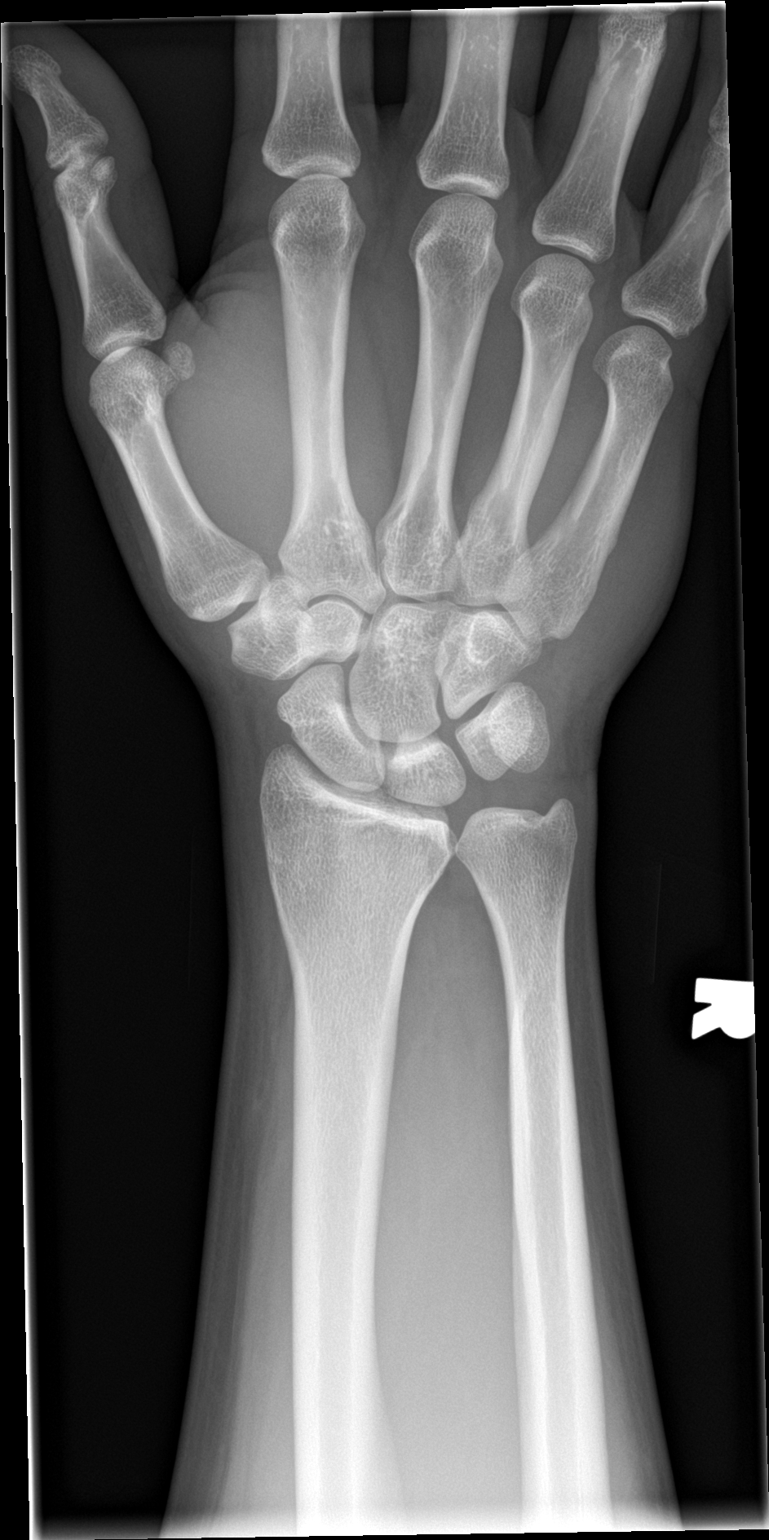

[wrist obl]
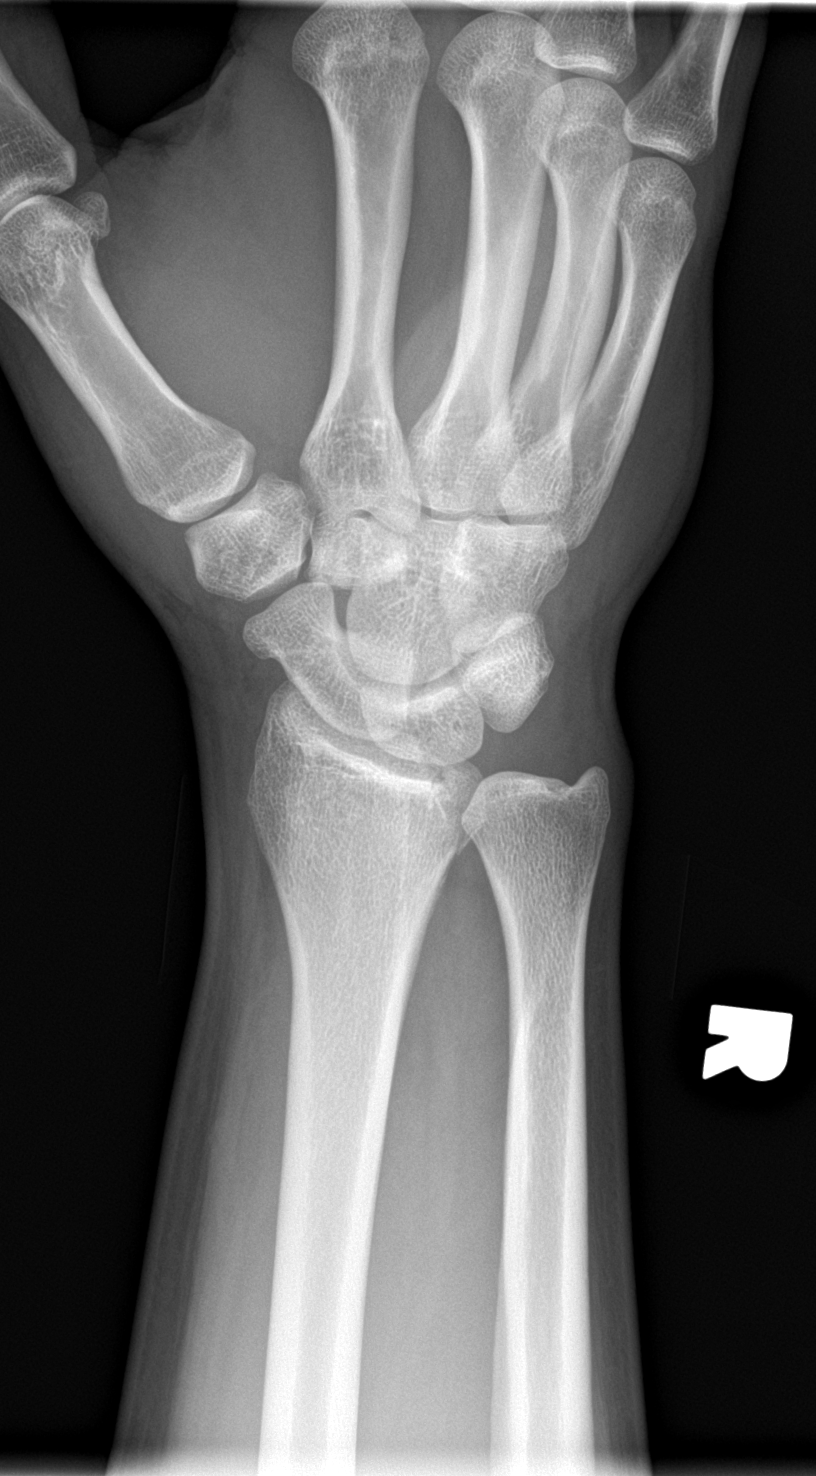

[wrist lat]
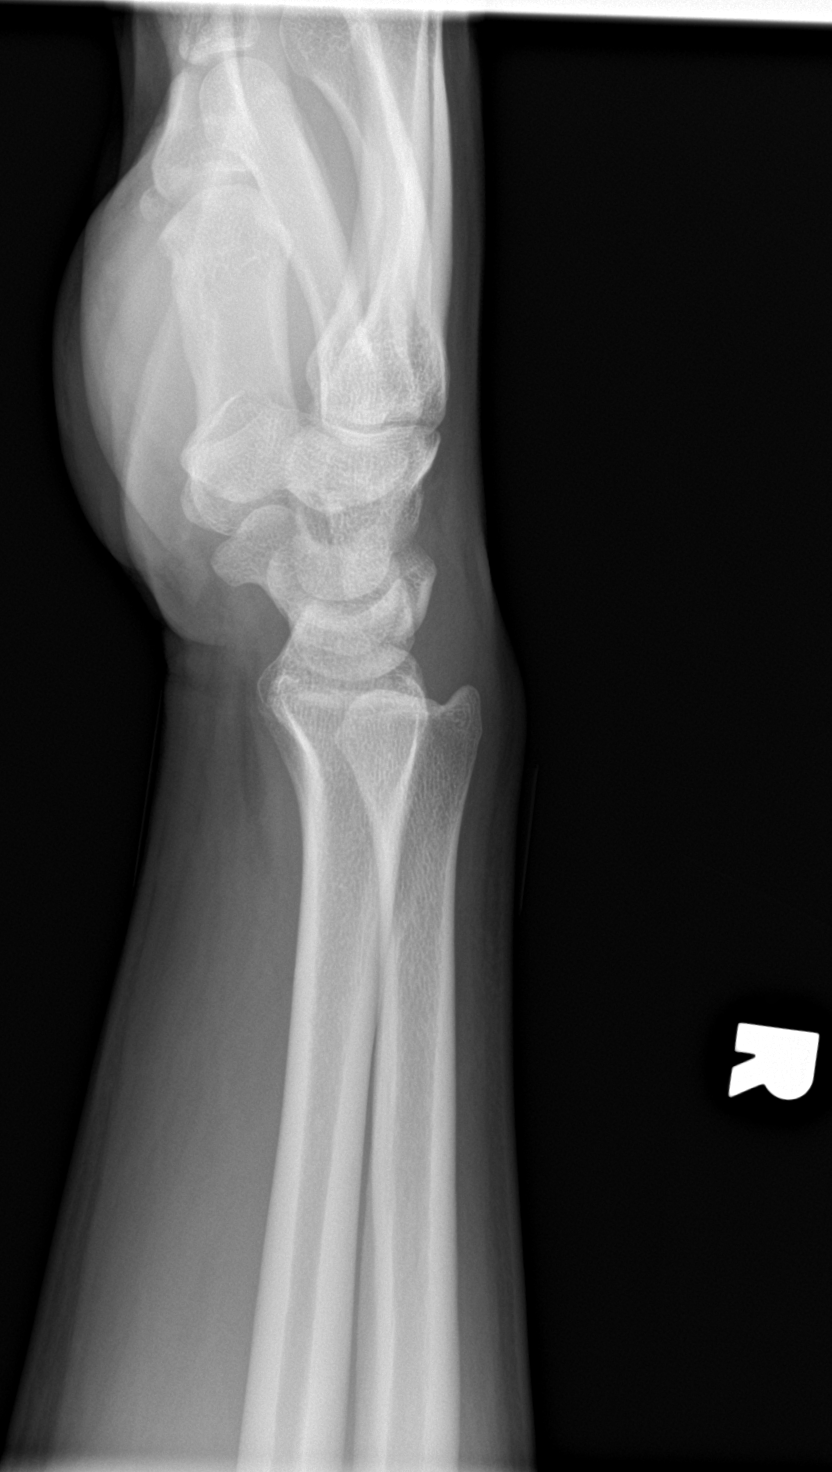

[wrist navicular]
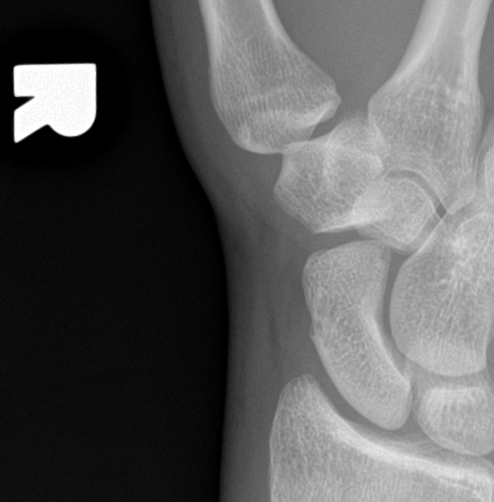

[4 of 4 positions shown; findings below may reference images not displayed]

FINDINGS: There is no evidence of fracture or dislocation. There is no
evidence of arthropathy or other focal bone abnormality. Soft
tissues are unremarkable.
IMPRESSION: Negative.
